# Patient Record
Sex: Female | Born: 1935 | Race: White | Hispanic: No
Health system: Southern US, Community
[De-identification: ages and names within clinical notes are randomized; demographics above are authoritative.]

---

## 2013-05-12 ENCOUNTER — Emergency Department: Payer: Self-pay | Admitting: Emergency Medicine

## 2014-01-08 ENCOUNTER — Ambulatory Visit: Payer: Self-pay | Admitting: Nurse Practitioner

## 2014-01-29 ENCOUNTER — Inpatient Hospital Stay: Payer: Self-pay | Admitting: Internal Medicine

## 2014-01-29 LAB — COMPREHENSIVE METABOLIC PANEL
ALBUMIN: 2.4 g/dL — AB (ref 3.4–5.0)
AST: 31 U/L (ref 15–37)
Alkaline Phosphatase: 100 U/L
Anion Gap: 10 (ref 7–16)
BILIRUBIN TOTAL: 0.3 mg/dL (ref 0.2–1.0)
BUN: 39 mg/dL — ABNORMAL HIGH (ref 7–18)
CALCIUM: 9.1 mg/dL (ref 8.5–10.1)
CHLORIDE: 101 mmol/L (ref 98–107)
CO2: 27 mmol/L (ref 21–32)
Creatinine: 1.36 mg/dL — ABNORMAL HIGH (ref 0.60–1.30)
EGFR (African American): 47 — ABNORMAL LOW
EGFR (Non-African Amer.): 39 — ABNORMAL LOW
Glucose: 140 mg/dL — ABNORMAL HIGH (ref 65–99)
OSMOLALITY: 287 (ref 275–301)
Potassium: 3.4 mmol/L — ABNORMAL LOW (ref 3.5–5.1)
SGPT (ALT): 16 U/L
Sodium: 138 mmol/L (ref 136–145)
Total Protein: 7.3 g/dL (ref 6.4–8.2)

## 2014-01-29 LAB — CBC WITH DIFFERENTIAL/PLATELET
Basophil #: 0 10*3/uL (ref 0.0–0.1)
Basophil %: 0.6 %
Eosinophil #: 0.1 10*3/uL (ref 0.0–0.7)
Eosinophil %: 0.7 %
HCT: 38.4 % (ref 35.0–47.0)
HGB: 12.4 g/dL (ref 12.0–16.0)
LYMPHS ABS: 2.1 10*3/uL (ref 1.0–3.6)
Lymphocyte %: 24.3 %
MCH: 31.5 pg (ref 26.0–34.0)
MCHC: 32.2 g/dL (ref 32.0–36.0)
MCV: 98 fL (ref 80–100)
Monocyte #: 0.9 x10 3/mm (ref 0.2–0.9)
Monocyte %: 11 %
Neutrophil #: 5.5 10*3/uL (ref 1.4–6.5)
Neutrophil %: 63.4 %
Platelet: 238 10*3/uL (ref 150–440)
RBC: 3.93 10*6/uL (ref 3.80–5.20)
RDW: 16.3 % — ABNORMAL HIGH (ref 11.5–14.5)
WBC: 8.6 10*3/uL (ref 3.6–11.0)

## 2014-02-01 LAB — BASIC METABOLIC PANEL
Anion Gap: 8 (ref 7–16)
BUN: 16 mg/dL (ref 7–18)
CREATININE: 0.88 mg/dL (ref 0.60–1.30)
Calcium, Total: 7.8 mg/dL — ABNORMAL LOW (ref 8.5–10.1)
Chloride: 107 mmol/L (ref 98–107)
Co2: 29 mmol/L (ref 21–32)
EGFR (Non-African Amer.): >60
GLUCOSE: 136 mg/dL — AB (ref 65–99)
Osmolality: 290 (ref 275–301)
Potassium: 2.8 mmol/L — ABNORMAL LOW (ref 3.5–5.1)
Sodium: 144 mmol/L (ref 136–145)

## 2014-02-01 LAB — CULTURE, BLOOD (SINGLE)

## 2014-02-01 LAB — MAGNESIUM: MAGNESIUM: 1.6 mg/dL — AB

## 2014-02-01 LAB — CBC WITH DIFFERENTIAL/PLATELET
BASOS ABS: 0 10*3/uL (ref 0.0–0.1)
BASOS PCT: 0.5 %
EOS ABS: 0.1 10*3/uL (ref 0.0–0.7)
Eosinophil %: 1.1 %
HCT: 30.1 % — ABNORMAL LOW (ref 35.0–47.0)
HGB: 9.9 g/dL — ABNORMAL LOW (ref 12.0–16.0)
Lymphocyte #: 2 10*3/uL (ref 1.0–3.6)
Lymphocyte %: 28.9 %
MCH: 32.1 pg (ref 26.0–34.0)
MCHC: 32.8 g/dL (ref 32.0–36.0)
MCV: 98 fL (ref 80–100)
Monocyte #: 0.7 x10 3/mm (ref 0.2–0.9)
Monocyte %: 10.1 %
NEUTROS ABS: 4.2 10*3/uL (ref 1.4–6.5)
Neutrophil %: 59.4 %
PLATELETS: 193 10*3/uL (ref 150–440)
RBC: 3.08 10*6/uL — ABNORMAL LOW (ref 3.80–5.20)
RDW: 15.9 % — ABNORMAL HIGH (ref 11.5–14.5)
WBC: 7.1 10*3/uL (ref 3.6–11.0)

## 2014-02-02 LAB — BASIC METABOLIC PANEL
Anion Gap: 5 — ABNORMAL LOW (ref 7–16)
BUN: 12 mg/dL (ref 7–18)
Calcium, Total: 8 mg/dL — ABNORMAL LOW (ref 8.5–10.1)
Chloride: 109 mmol/L — ABNORMAL HIGH (ref 98–107)
Co2: 30 mmol/L (ref 21–32)
Creatinine: 0.71 mg/dL (ref 0.60–1.30)
EGFR (African American): >60
GLUCOSE: 102 mg/dL — AB (ref 65–99)
Osmolality: 287 (ref 275–301)
POTASSIUM: 3 mmol/L — AB (ref 3.5–5.1)
SODIUM: 144 mmol/L (ref 136–145)

## 2014-02-03 LAB — CULTURE, BLOOD (SINGLE)

## 2014-02-03 LAB — VANCOMYCIN, TROUGH: Vancomycin, Trough: 7 ug/mL — ABNORMAL LOW (ref 10–20)

## 2014-02-04 LAB — BASIC METABOLIC PANEL
Anion Gap: 7 (ref 7–16)
BUN: 10 mg/dL (ref 7–18)
CALCIUM: 8.1 mg/dL — AB (ref 8.5–10.1)
CO2: 25 mmol/L (ref 21–32)
Chloride: 111 mmol/L — ABNORMAL HIGH (ref 98–107)
Creatinine: 0.65 mg/dL (ref 0.60–1.30)
EGFR (African American): >60
Glucose: 102 mg/dL — ABNORMAL HIGH (ref 65–99)
OSMOLALITY: 284 (ref 275–301)
POTASSIUM: 4 mmol/L (ref 3.5–5.1)
Sodium: 143 mmol/L (ref 136–145)

## 2014-02-04 LAB — CBC WITH DIFFERENTIAL/PLATELET
BASOS PCT: 0.6 %
Basophil #: 0 10*3/uL (ref 0.0–0.1)
EOS PCT: 1.9 %
Eosinophil #: 0.1 10*3/uL (ref 0.0–0.7)
HCT: 30.8 % — ABNORMAL LOW (ref 35.0–47.0)
HGB: 10.2 g/dL — AB (ref 12.0–16.0)
Lymphocyte #: 1.9 10*3/uL (ref 1.0–3.6)
Lymphocyte %: 27.5 %
MCH: 32.6 pg (ref 26.0–34.0)
MCHC: 33 g/dL (ref 32.0–36.0)
MCV: 99 fL (ref 80–100)
MONO ABS: 0.6 x10 3/mm (ref 0.2–0.9)
Monocyte %: 8.2 %
NEUTROS PCT: 61.8 %
Neutrophil #: 4.4 10*3/uL (ref 1.4–6.5)
Platelet: 226 10*3/uL (ref 150–440)
RBC: 3.11 10*6/uL — ABNORMAL LOW (ref 3.80–5.20)
RDW: 16 % — AB (ref 11.5–14.5)
WBC: 7.1 10*3/uL (ref 3.6–11.0)

## 2014-02-04 LAB — MAGNESIUM: Magnesium: 1.8 mg/dL

## 2014-02-06 LAB — CULTURE, BLOOD (SINGLE)

## 2014-02-08 ENCOUNTER — Ambulatory Visit: Payer: Self-pay | Admitting: Nurse Practitioner

## 2014-06-01 NOTE — Consult Note (Signed)
Brief Consult Note: Diagnosis: ulcers right foot.   Patient was seen by consultant.   Consult note dictated.   Recommend further assessment or treatment.   Comments: I reviewed xrays and currently I am not convinced of presence of osteomyelitis to either the 1st or 5th metatarsals..  Both wounds have granular tissue and do not appear to be drainage any purulence.  I am concerned about the multiple blisters and her vascular status.  Recommend consertavative care for  present.  Electronic Signatures: Epimenio Sarinroxler, Arbor Leer G (MD)  (Signed 23-Dec-15 17:02)  Authored: Brief Consult Note   Last Updated: 23-Dec-15 17:02 by Epimenio Sarinroxler, Semaj Kham G (MD)

## 2014-06-01 NOTE — Discharge Summary (Signed)
Dates of Admission and Diagnosis:  Date of Admission 29-Jan-2014   Date of Discharge 04-Feb-2014   Admitting Diagnosis right foot wound   Final Diagnosis Infected wound on right foot- cellulitis hypertension hyperlipidemia dementia Acute renal failure- improved    Chief Complaint/History of Present Illness The patient is a poor historian due to her severe dementia, hard of hearing, and poor vision.  Ms. Tonche is a very pleasant 81 year old Caucasian female who has been a resident at [facility] for the last 2 years due to her worsening dementia.  Per son, the patient is mainly bedbound. She has not walked in the last 2 years.  She wears protective boots.  Recently, the staff at the facility started noticing the patient is having discharge from her right foot along with some blisters and redness in the right foot.  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  She is being admitted for further evaluation and management.  She received IV Zosyn in the Emergency Room.  The patient's baseline is she is severely demented.  She is total care as far as feeding, bathing, and is bedbound.  She is very hard of hearing and has very poor vision due to her mature cataracts which have not been removed.   Allergies:  No Known Allergies:   Pertinent Past History:  Pertinent Past History 1.  Severe dementia. 2.  Impaired hearing.   3.  Anxiety, depression. 4.  Hypercholesterolemia. 5.  Poor vision.   Hospital Course:  Hospital Course 81 yo female w/ hx of severe dementia, bedbound, HTN, hyperlipidemia who presented to the hospital w/ right foot ulcer, drainange and noted to right 5th MTP joint Osteomyelitis.   * Right foot Osteomyelitis - cont. IV vanc + Zosyn for now.  - appreciated Podiatry input may not need further debridement at this time.  - afebrile, WBC count normal.   - swich to oral and d/c to NH.  * Bacteremia- -  Gram Positive cocci in Blood cx- staph hominis- started vancomycin, check BMP daily. stable. - repeat Bl cx is negative. may be just contamination- as only one culture was positive. - Called palliative care consult. - daughter clarifies that she may return back to NH with hospice.  * HTN - hemodynamically stable.  - hold HCTZ for now.   * Acute Renal failure - mild and likely due to dehydration.  - cont. gentle IV fluids and follow BUn/Cr. now better. - hold HCTZ  * Hypokalemia   replacing.  * Dementia - bedbound at baseline.  - no acute issue.   * Hyperlipidemia -cont. Lovastatin  * GERD - cont. Ranitidine. d/c to NH with hospice today.   Condition on Discharge Stable   Code Status:  Code Status No Code/Do Not Resuscitate   DISCHARGE INSTRUCTIONS HOME MEDS:  Medication Reconciliation: Patient's Home Medications at Discharge:     Medication Instructions  vitamin d2 50,000 intl units oral capsule  1 cap(s) orally once a week on Friday   ferrous sulfate 220 mg/5 ml oral elixir  5 milliliter(s) orally 2 times a day   ranitidine 150 mg oral tablet  1 tab(s) orally 2 times a day   lovastatin 20 mg oral tablet  1 tab(s) orally once a day (at bedtime)   potassium chloride 10 meq oral tablet, extended release  1 tab(s) orally once a day (at bedtime)   tylenol 325 mg oral tablet  1  tab(s) orally every 4 hours, As Needed - for Pain   santyl 250 units/g topical ointment  Apply topically to affected area once a day   ativan 0.5 mg oral tablet  1 tab(s) orally 2 times a day (at lunch and at dinner), as needed for agitation   amoxicillin-clavulanate  500 milligram(s) orally every 8 hours x 8 days    PRESCRIPTIONS: PRINTED AND PLACED ON CHART  STOP TAKING THE FOLLOWING MEDICATION(S):    hydrochlorothiazide 25 mg oral tablet: 1 tab(s) orally once a day  Physician's Instructions:  Diet Low Sodium   Activity Limitations As tolerated   Return to Work Not Applicable   Time frame  for Follow Up Appointment 2-4 weeks  PMD     Mickey Farberhies, David N(Family Physician): [facility] [place], [address], [place], [place] [address], [phone]  TIME SPENT:  Total Time: Greater than 30 minutes   Electronic Signatures: Altamese DillingVachhani, Tanasia Budzinski (MD)  (Signed 28-Dec-15 12:00)  Authored: ADMISSION DATE AND DIAGNOSIS, CHIEF COMPLAINT/HPI, Allergies, PERTINENT PAST HISTORY, HOSPITAL COURSE, DISCHARGE INSTRUCTIONS HOME MEDS, PATIENT INSTRUCTIONS, Follow Up Physician, TIME SPENT   Last Updated: 28-Dec-15 12:00 by Altamese DillingVachhani, Jakevion Arney (MD)

## 2014-06-01 NOTE — H&P (Signed)
PATIENT NAME:  Erika Dixon, Erika Dixon MR#:  [id number] DATE OF BIRTH:  Jul 10, 1920  CHIEF COMPLAINT: Infection of the right foot.  History is obtained from old records, patient's son, Chanetta MarshallJimmy, who is present in the Emergency Room.   HISTORY OF PRESENT ILLNESS: The patient is a poor historian due to her severe dementia, hard of hearing, and poor vision.  Ms. Erika Dixon is a very pleasant 81 year old Caucasian female who has been a resident at [facility] for the last 2 years due to her worsening dementia.  Per son, the patient is mainly bedbound. She has not walked in the last 2 years.  She wears protective boots.  Recently, the staff at the facility started noticing the patient is having discharge from her right foot along with some blisters and redness in the right foot.  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  She is being admitted for further evaluation and management.  She received IV Zosyn in the Emergency Room.  The patient's baseline is she is severely demented.  She is total care as far as feeding, bathing, and is bedbound.  She is very hard of hearing and has very poor vision due to her mature cataracts which have not been removed.   CODE STATUS:  No code, DO NOT RESUSCITATE.  This was discussed with the patient's son, Laurelyn SickleJimmy Winters.  PAST MEDICAL HISTORY:   1.  Severe dementia. 2.  Impaired hearing.   3.  Anxiety, depression. 4.  Hypercholesterolemia. 5.  Poor vision.  ALLERGIES:  No known drug allergies.   MEDICATIONS:   1.  Vitamin D2, 50,000 International Units once a week. 2.  Tylenol 325 one tablet every 4 hours as needed.   3.  Santyl 250 units per gram topical, applied to affected area once a day. 4.  Ranitidine 150 mg b.i.d.  5.  Potassium chloride 10 mEq p.o. daily at bedtime. 6.  Lovastatin 20 mg p.o. daily at bedtime. 7.  Hydrochlorothiazide 25 mg p.o. once daily. 8.  Ferrous sulfate 220 mg per 5 mL b.i.d.   9.  Ativan 0.5 mg 1 tablet b.i.d.   REVIEW OF SYSTEMS:  Unable to obtain, patient has severe dementia and is very hard of hearing.  PHYSICAL EXAMINATION: GENERAL: The patient is lying comfortably in the Emergency Room.  She does not interact much due to her poor hearing and vision. VITAL SIGNS:  She is afebrile. Pulse is 85. Blood pressure 126/78.  Saturations are 98% on room air.  HEENT: Atraumatic, normocephalic. PERRLA, EOM intact. Oral mucosa is moist. Poor dental hygiene.  NECK: Supple. No JVD. No carotid bruit.  RESPIRATORY: Clear to auscultation bilaterally. No rales, rhonchi, respiratory distress, or labored breathing.  CARDIOVASCULAR: Both the heart sounds are normal.  Rate and rhythm regular. PMI not lateralized. Chest nontender. EXTREMITIES: The patient's right foot shows a blister along with some cellulitis and a necrotic area on her right MTP joint, around the right 5th digit with serosanguineous pus-looking discharge present.   ABDOMEN: Soft, benign, nontender.  No organomegaly.  Positive bowel sounds.   NEUROLOGIC: The patient does not have any focal deficits per se.  She is unable to assess in detail due to her severe dementia.  She moves all extremities well. PSYCHIATRIC:  The patient is awake. She is alert, but disoriented to time, place, person, due to her severe dementia.  LABORATORY DATA:  White count is 8.6, H and H  is 12.4 and 38.4.  Glucose 140, BUN is 39, creatinine 1.36. Sodium is 140, potassium is 3.4.  Rest of the LFTs appeared normal.  Albumin is 2.4.  IMAGING:  Right foot x-ray shows osteomyelitis, septic arthritis of 5th MTP joint.    ASSESSMENT: The 81 year old Ms. Erika Dixon with severe dementia comes in with: 1.   Right metatarsophalangeal joint osteomyelitis with septic arthritis as noted on x-ray with cellulitis.  Will admit patient on medical floor, start intravenous Zosyn, have podiatric consultation, follow blood cultures and wound cultures.  Await further  evaluation and recommendations per podiatry. 2.  Severe dementia. The patient is much bedbound and requires assistance with all daily routines.  3.  Mild dehydration, renal insufficiency, suspect due to hydrochlorothiazide and will hold off it for now.  Give intravenous fluids and monitor creatinine. 4.  Impaired hearing and vision loss. 5.  Hyperlipidemia, on lovastatin. 6.  Deep venous thrombosis prophylaxis, subcutaneous heparin t.i.d.  7.  Care management, social worker for discharge planning.   Above was discussed with the patient's son, Erika Dixon, who was present in the Emergency Room.  She is a no code, DNR per son.  TIME SPENT:  50 minutes.   ____________________________ Wylie Hail Allena Katz, MD sap:LT D: 01/29/2014 18:10:03 ET T: 01/29/2014 18:26:45 ET JOB#: [id number]  cc: Krista Som A. Allena Katz, MD, <Dictator> Willow Ora MD ELECTRONICALLY SIGNED 02/05/2014 18:41

## 2014-06-05 NOTE — Consult Note (Signed)
PATIENT NAME:  Erika Dixon, Erika Dixon MR#:  [id number] DATE OF BIRTH:  29-Apr-1920  DATE OF CONSULTATION:  01/30/2014  CONSULTING PHYSICIAN:  Rhona Raider. Theophilus Walz, DPM  REASON FOR CONSULTATION:  Ulcerations with possible osteomyelitis to the right foot.   HISTORY OF PRESENT ILLNESS: The patient was admitted to the hospital after being brought in because of a right foot infection. She is a resident out at  [facility] and has significant dementia and is a poor historian and does not communicate well. The son was with her and Emergency Room but not present when I did this consultation.  The staff recently started noticing some ulcerations developing on the right foot and some blisters with increasing redness. She went to the ER, x-rays were taken and there is a question about possible osteomyelitis in the first and fifth metatarsal heads. She currently is receiving IV antibiotics including Zosyn. She is bedbound and does not really ambulate. She is in a DO NOT RESUSCITATE CODE STATUS.   PAST MEDICAL HISTORY: Includes severe dementia, impaired hearing, depression, hypercholesterolemia, and vision issues.   ALLERGIES: NKDA.   CURRENT MEDICATIONS: Include hydrochlorothiazide 25 mg p.o. daily, iron supplements, 220 mg per 5 mL b.i.d. and Ativan 0.5 mg b.i.d. 1 tablet lovastatin 20 mg p.o. at bedtime, potassium supplement 10 mEq at bedtime, ranitidine 150 mg b.i.d., Santyl 250 units has been applied to the wounds.  Tylenol 325 mg every 4 hours as needed, also takes vitamin D supplement.   REVIEW OF SYSTEMS:  Includes the fact that she is not well oriented, she has dementia. She is somewhat responsive, but very poor in her ability to communicate.  Vital signs include a temperature of 97.7, pulse 72, respirations 18, blood pressure 98/59, and pulse oximetry 95%. Lower extremity exam: Vascular deep pulses are difficult to palpate. Dermatologic: The patient has multiple dried blood blisters on the top of the right foot  and the sides of the right foot. There is some erythema and cellulitis to the foot, but it seems to be better from where they had it marked yesterday. There are wounds on the fifth metatarsal head laterally and the first metatarsal head medially. These do have basically good granular bases and did not appear to have much drainage to these regions. Right now there does not appear to be a real deep penetration.   X-rays reviewed, three views, particularly paying attention to the first and fifth metatarsophalangeal joints. There are some changes of the first metatarsal head, which may be suggestive of an old surgery.  I am not convinced that there is osteomyelitis at this point. The fifth metatarsal head is possibly a little degenerative, but it does not appear to be significantly broken down and there is some pre-subluxation or partial subluxation to the fifth metatarsophalangeal joint but, once again, I am not convinced there is osteomyelitis in this region at this time frame.   CLINICAL IMPRESSION:  Ulcerations first and fifth metatarsal heads.   TREATMENT PLAN: Uncertain as the patient's overall neurovascular status may have vascular to take a look at her at some point. Right now, I think she just needs palliative care with daily wound dressings. She had nothing on her foot when I came in the room. I would suggest some Bactroban ointment for right now with heavy gauze padding, usual wet-to-dry dressing and use Kerlix to help give better support and stability for the foot and reduce some of the stress and pressure on the ulcerations. I would not recommend any type  of surgical intervention from a foot standpoint at this juncture. I will continue to follow her for the next couple of days to see how well she is doing with it.    ____________________________ Rhona RaiderMatthew G. Abagale Boulos, DPM mgt:at D: 01/30/2014 16:59:12 ET T: 01/30/2014 17:35:56 ET JOB#: [id number]  cc: Rhona RaiderMatthew G. Keshauna Degraffenreid, DPM, <Dictator> Epimenio SarinMATTHEW G  Shep Porter MD ELECTRONICALLY SIGNED 02/21/2014 12:51

## 2014-08-09 DEATH — deceased

## 2015-09-02 IMAGING — CR RIGHT FOOT COMPLETE - 3+ VIEW
1 series · 3 of 3 positions shown · non-contrast
Comparison: None.

CLINICAL DATA: Pain and infection.  Rule out osteomyelitis.

EXAM:
RIGHT FOOT COMPLETE - 3+ VIEW

[Series 1: dxr foot rt complete w/obliques · 0.14mm/px · 3 of 3 slices shown]
[im 1/3]
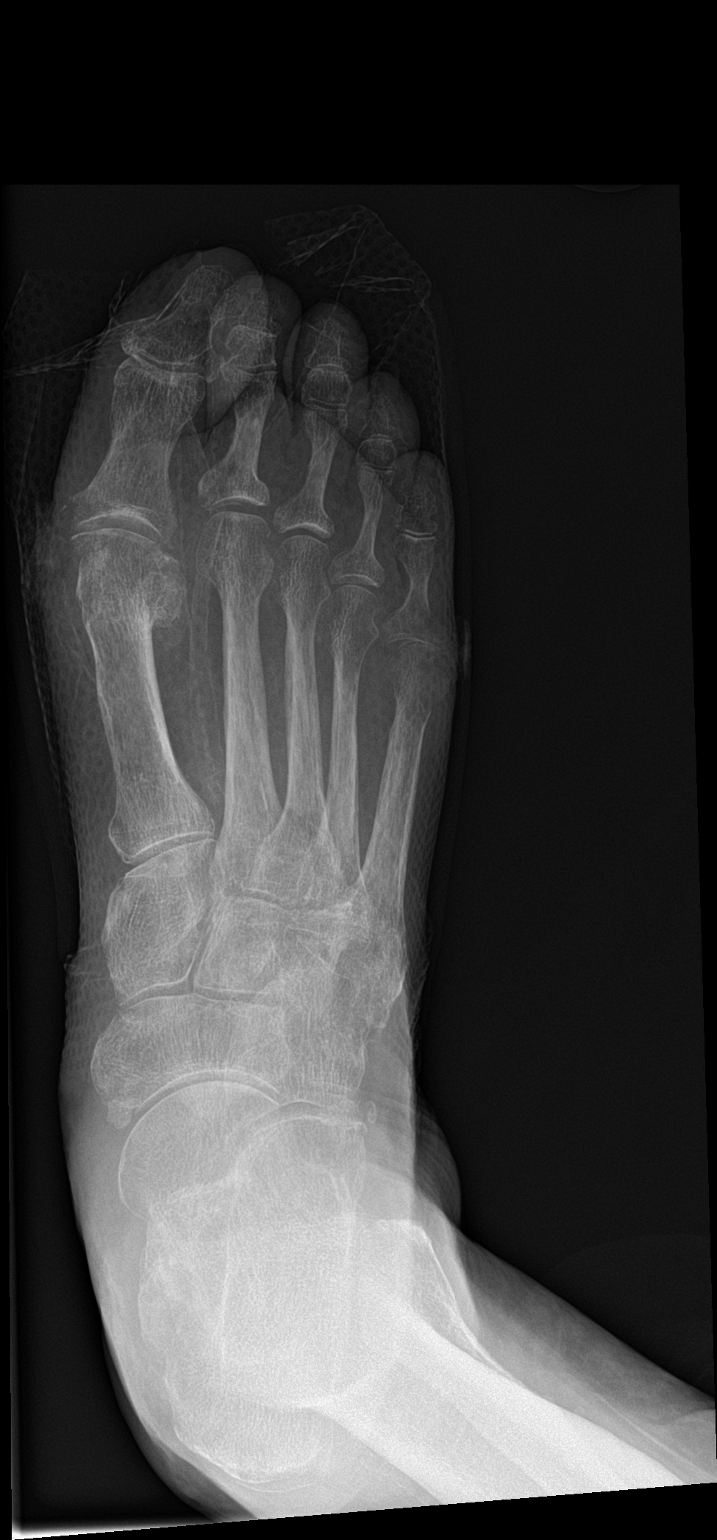
[im 2/3]
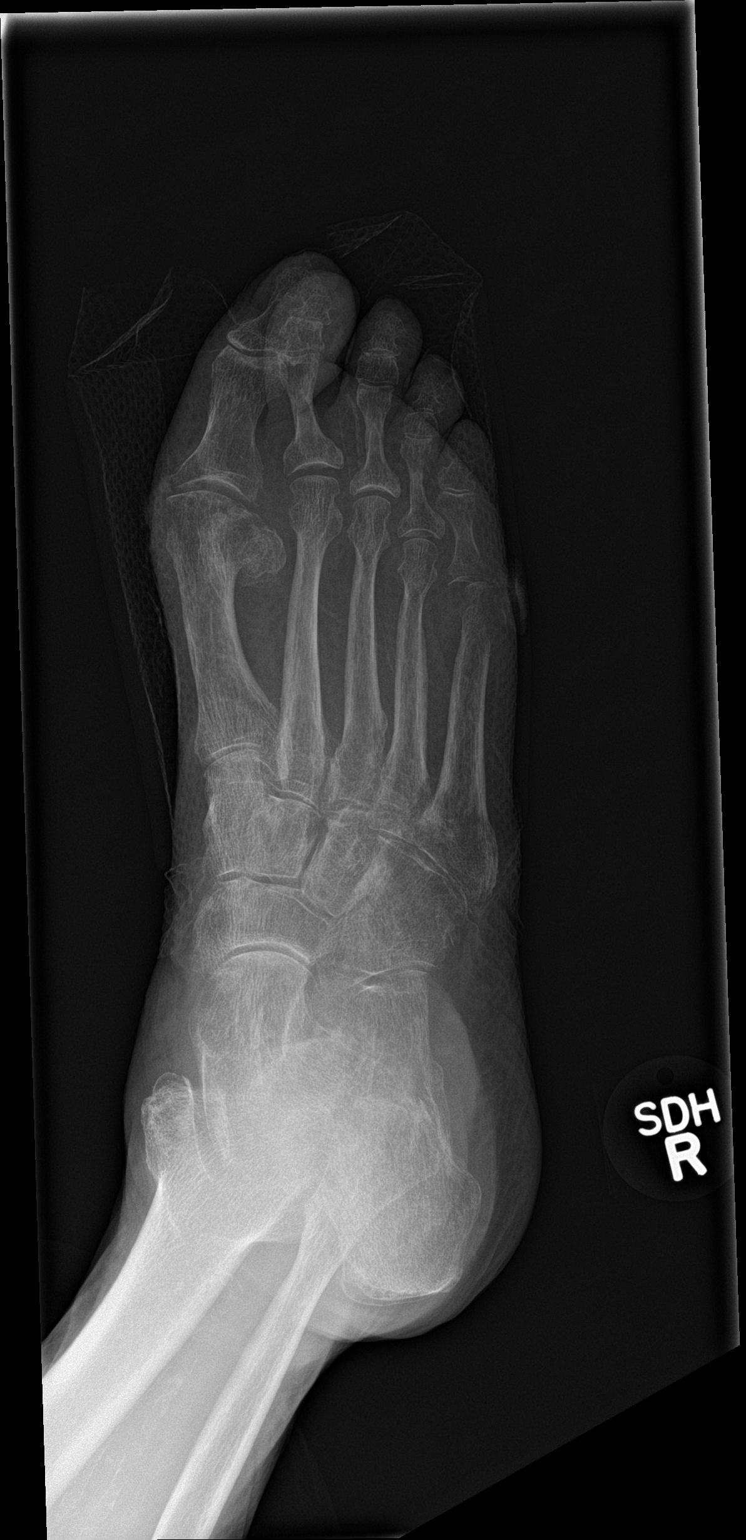
[im 3/3]
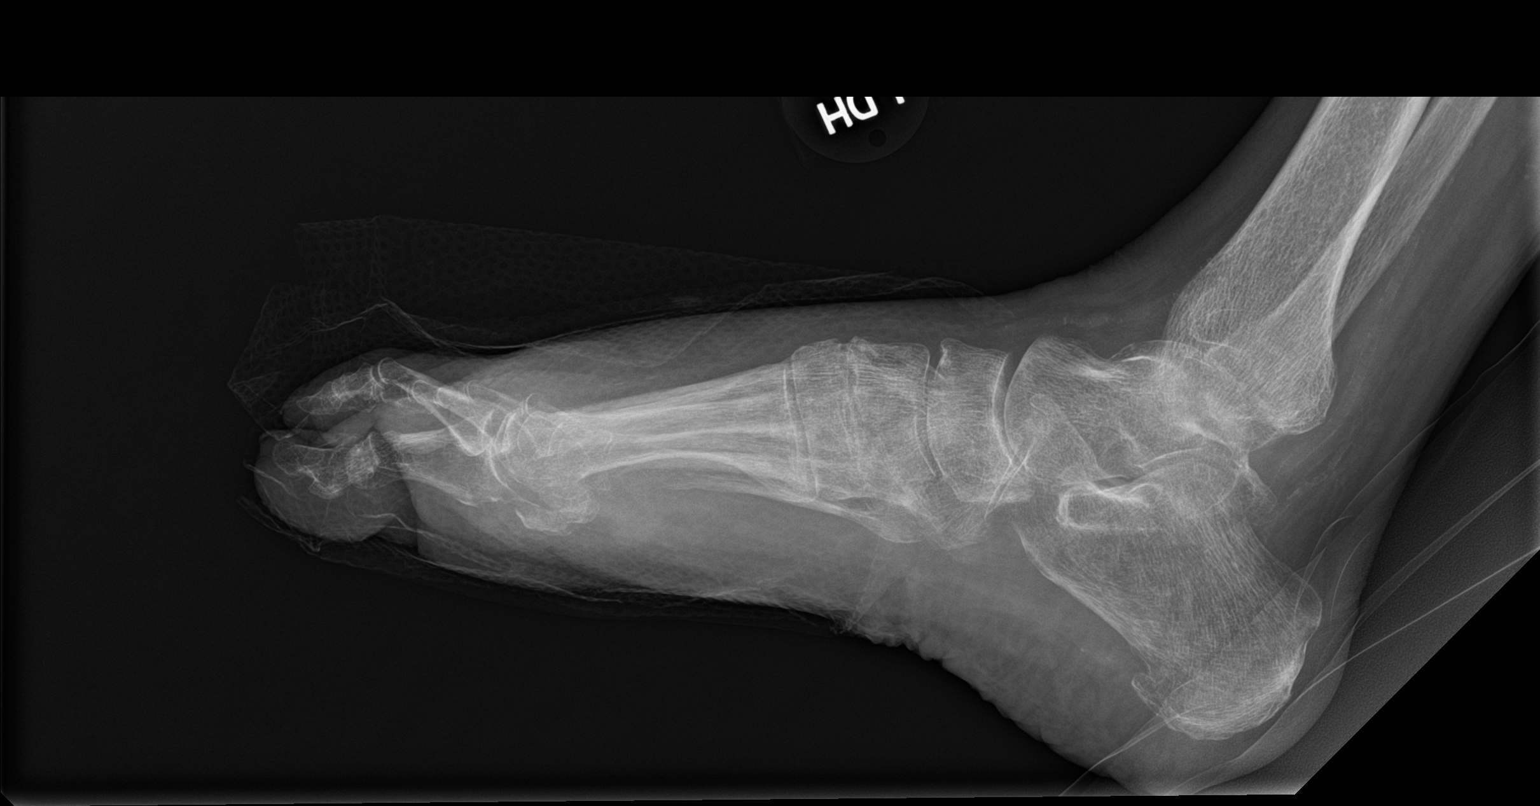

[3 of 3 positions shown; findings below may reference images not displayed]

FINDINGS: Bony resorption and cortical destruction of the distal fifth
metatarsal. Subluxation of the fifth MTP joint compatible with
septic arthritis.

Cortical erosion of the medial aspect of the distal first metatarsal
and proximal first phalanx which may be due to osteomyelitis. There
is overlying skin ulcer in this area.

Negative for fracture.
IMPRESSION: Osteomyelitis and septic arthritis of the fifth MTP joint

Probable osteomyelitis of the medial first MTP joint.
